# Patient Record
Sex: Male | Born: 1995 | Race: White | Hispanic: No | Marital: Single | Smoking: Never smoker
Health system: Southern US, Community
[De-identification: ages and names within clinical notes are randomized; demographics above are authoritative.]

## PROBLEM LIST (undated history)

## (undated) DIAGNOSIS — F329 Major depressive disorder, single episode, unspecified: Secondary | ICD-10-CM

## (undated) DIAGNOSIS — F32A Depression, unspecified: Secondary | ICD-10-CM

---
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
## 2015-03-08 ENCOUNTER — Encounter: Payer: Self-pay | Admitting: Medical

## 2015-03-08 ENCOUNTER — Ambulatory Visit (INDEPENDENT_AMBULATORY_CARE_PROVIDER_SITE_OTHER): Payer: Medicaid Other | Admitting: Medical

## 2015-03-08 VITALS — BP 110/64 | HR 68 | Wt 154.0 lb

## 2015-03-08 DIAGNOSIS — R21 Rash and other nonspecific skin eruption: Secondary | ICD-10-CM

## 2015-03-08 DIAGNOSIS — Z202 Contact with and (suspected) exposure to infections with a predominantly sexual mode of transmission: Secondary | ICD-10-CM | POA: Diagnosis not present

## 2015-03-08 DIAGNOSIS — N489 Disorder of penis, unspecified: Secondary | ICD-10-CM

## 2015-03-08 NOTE — Progress Notes (Signed)
Subjective: Chief Complaint  Patient presents with  . poss std?    has a bump on head of penis. noticed a week ago and was gone two days later. has not been using condoms.    Here as a new patient.  In [employer] for [employer].  [place]   Here for STD check.  No prior STD screen or prior STD.   Has had 2 sexual partners.  currently is sexually active with girlfriend and had some recent somewhat rough sex.  The next day noticed some redness and bumps under skin on underneath side of penis glans.  It resolved in about 2 days.   That was earlier in the week, and he currently has no rash.   Wanted to come in and get checked out though.  Uses condoms some time.  Denies dysuria, burning with urination, urinary fervency, discharge, fever, or abdominal or back pain.  He notes that his girlfriend got checked out and was negative for STD.  No other aggravating or relieving factors. No other complaint.  Past Medical History  Diagnosis Date  . Depression    ROS  As in subjective  Objective: BP 110/64 mmHg  Pulse 68  Wt 154 lb (69.854 kg)  Gen: wd, wn, nad, white male, lean Few shoddy inguinal bilat small nodes, but no swollen or tender nodes GU: normal male genitalia, circumcised, no mass, no rash, no hernia, no swelling, nontender    Assessment: Encounter Diagnoses  Name Primary?  . Venereal disease contact Yes  . Penile rash     Plan: No exam abnormality today.  Recent rash likely abrasion or inflammation from mechanical friction with rough sex.   discussed safe sex, condom use, prevention, making sure things are properly lubricated.  discussed STD screening, routine f/u.    Labs today, and we will call with lab results.

## 2015-03-09 LAB — GC/CHLAMYDIA PROBE AMP
CT Probe RNA: NOT DETECTED
GC Probe RNA: NOT DETECTED

## 2015-03-09 LAB — RPR

## 2015-03-09 LAB — HIV ANTIBODY (ROUTINE TESTING W REFLEX): HIV 1&2 Ab, 4th Generation: NONREACTIVE

## 2015-11-17 ENCOUNTER — Encounter: Payer: Self-pay | Admitting: Family Medicine

## 2015-11-17 ENCOUNTER — Ambulatory Visit (INDEPENDENT_AMBULATORY_CARE_PROVIDER_SITE_OTHER): Payer: Medicaid Other | Admitting: Family Medicine

## 2015-11-17 VITALS — BP 110/64 | HR 66 | Ht 69.0 in | Wt 141.0 lb

## 2015-11-17 DIAGNOSIS — Z008 Encounter for other general examination: Secondary | ICD-10-CM

## 2015-11-17 DIAGNOSIS — Z8709 Personal history of other diseases of the respiratory system: Secondary | ICD-10-CM | POA: Diagnosis not present
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
